# Patient Record
Sex: Female | Born: 1970 | Race: White | Hispanic: No | Marital: Married
Health system: Southern US, Community
[De-identification: ages and names within clinical notes are randomized; demographics above are authoritative.]

---

## 2014-06-08 ENCOUNTER — Emergency Department: Payer: Self-pay | Admitting: Student

## 2014-06-08 LAB — COMPREHENSIVE METABOLIC PANEL
ALBUMIN: 4 g/dL (ref 3.4–5.0)
Alkaline Phosphatase: 73 U/L
Anion Gap: 7 (ref 7–16)
BUN: 18 mg/dL (ref 7–18)
Bilirubin,Total: 0.2 mg/dL (ref 0.2–1.0)
CO2: 25 mmol/L (ref 21–32)
CREATININE: 0.73 mg/dL (ref 0.60–1.30)
Calcium, Total: 8.6 mg/dL (ref 8.5–10.1)
Chloride: 107 mmol/L (ref 98–107)
EGFR (African American): >60
Glucose: 102 mg/dL — ABNORMAL HIGH (ref 65–99)
OSMOLALITY: 280 (ref 275–301)
Potassium: 3.4 mmol/L — ABNORMAL LOW (ref 3.5–5.1)
SGOT(AST): 25 U/L (ref 15–37)
SGPT (ALT): 19 U/L
Sodium: 139 mmol/L (ref 136–145)
TOTAL PROTEIN: 7.8 g/dL (ref 6.4–8.2)

## 2014-06-08 LAB — TROPONIN I
Troponin-I: <0.02 ng/mL
Troponin-I: <0.02 ng/mL

## 2014-06-08 LAB — CBC
HCT: 40.8 % (ref 35.0–47.0)
HGB: 13.2 g/dL (ref 12.0–16.0)
MCH: 29.6 pg (ref 26.0–34.0)
MCHC: 32.3 g/dL (ref 32.0–36.0)
MCV: 92 fL (ref 80–100)
PLATELETS: 210 10*3/uL (ref 150–440)
RBC: 4.45 10*6/uL (ref 3.80–5.20)
RDW: 13.6 % (ref 11.5–14.5)
WBC: 8.9 10*3/uL (ref 3.6–11.0)

## 2014-06-08 LAB — PROTIME-INR
INR: 0.9
PROTHROMBIN TIME: 12.3 s (ref 11.5–14.7)

## 2014-06-08 LAB — APTT: Activated PTT: 28.3 secs (ref 23.6–35.9)

## 2014-06-09 ENCOUNTER — Telehealth: Payer: Self-pay

## 2014-06-09 NOTE — Telephone Encounter (Signed)
Called pt to schedule follow up ED appt, pt states since she lives in [place], she has made an appt with her PCP there.

## 2014-07-22 NOTE — Telephone Encounter (Signed)
This encounter was created in error - please disregard.

## 2016-07-09 IMAGING — CR DG CHEST 1V PORT
1 series · 1 of 1 positions shown · non-contrast
Comparison: None.

CLINICAL DATA: Anterior chest pain.  History of asthma

EXAM:
PORTABLE CHEST - 1 VIEW

[ap]
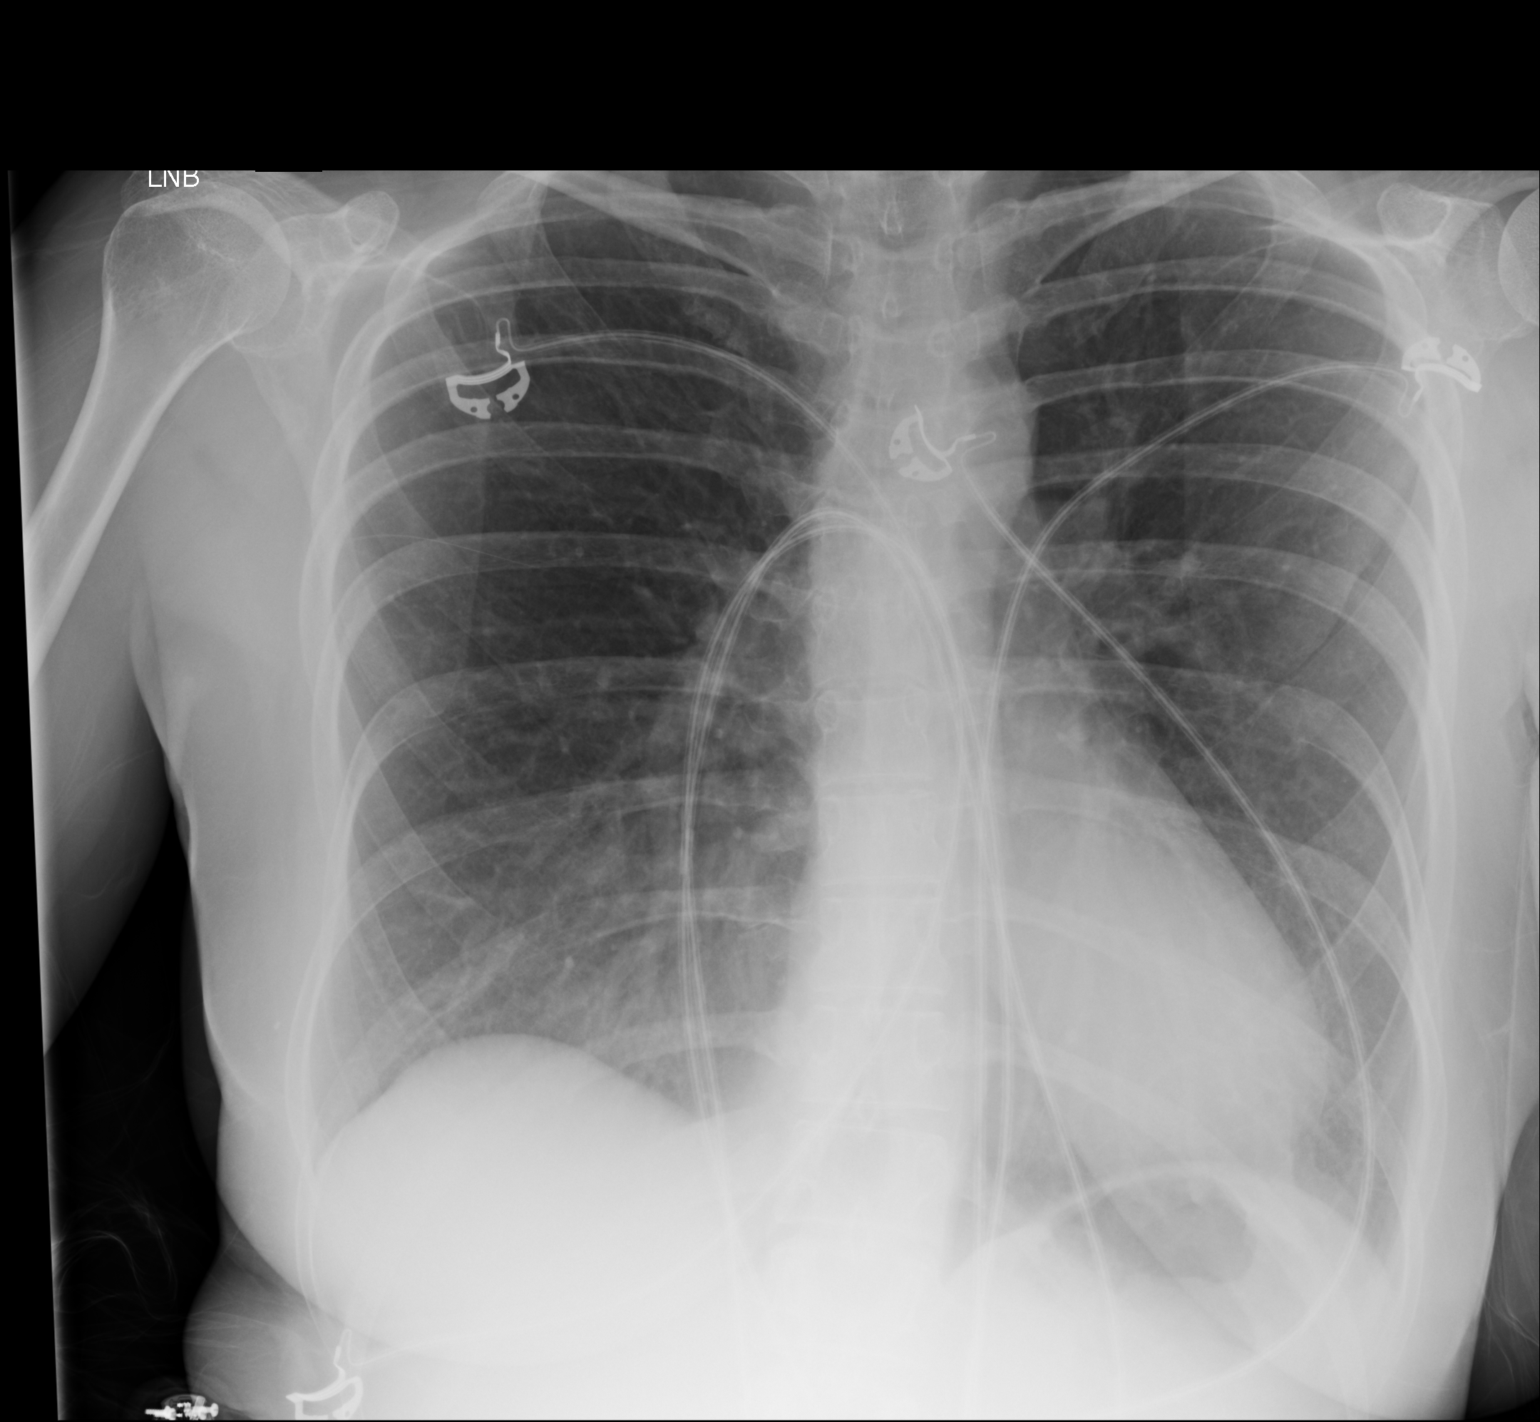

[1 of 1 positions shown; findings below may reference images not displayed]

FINDINGS: The heart size and mediastinal contours are within normal limits. -
the lungs appear hyperinflated but clear. -The visualized skeletal
structures are unremarkable.
IMPRESSION: 1. Lungs are hyperinflated but clear.
